# Patient Record
Sex: Male | Born: 1985 | Race: White | Hispanic: No | Marital: Married | Smoking: Never smoker
Health system: Southern US, Community
[De-identification: ages and names within clinical notes are randomized; demographics above are authoritative.]

## PROBLEM LIST (undated history)

## (undated) DIAGNOSIS — R7303 Prediabetes: Secondary | ICD-10-CM

## (undated) DIAGNOSIS — I10 Essential (primary) hypertension: Secondary | ICD-10-CM

## (undated) DIAGNOSIS — K760 Fatty (change of) liver, not elsewhere classified: Secondary | ICD-10-CM

## (undated) DIAGNOSIS — E785 Hyperlipidemia, unspecified: Secondary | ICD-10-CM

## (undated) DIAGNOSIS — K219 Gastro-esophageal reflux disease without esophagitis: Secondary | ICD-10-CM

## (undated) DIAGNOSIS — R519 Headache, unspecified: Secondary | ICD-10-CM

## (undated) DIAGNOSIS — G5600 Carpal tunnel syndrome, unspecified upper limb: Secondary | ICD-10-CM

---

## 2006-04-04 ENCOUNTER — Emergency Department (HOSPITAL_COMMUNITY): Admission: EM | Admit: 2006-04-04 | Discharge: 2006-04-04 | Payer: Self-pay | Admitting: Emergency Medicine

## 2008-08-18 IMAGING — CT CT CERVICAL SPINE W/O CM
4 of 6 series · 12 of 28 positions shown, 13 images · IV contrast (agent unspecified)
Comparison: none

CLINICAL DATA: 20-year-old male with left-sided head trauma, headache.
HEAD CT WITHOUT CONTRAST:
TECHNIQUE: Contiguous axial images were obtained from the base of the skull through the vertex according to standard protocol without contrast.
TECHNIQUE: Multidetector CT imaging of the cervical spine was performed.  Multiplanar CT image reconstructions were also generated.

[Series 3: recon 2: brain · axial · 0.47mm/px · z∈[-44,-7]mm · 2 of 80 slices shown]
[im 27/80  bone]
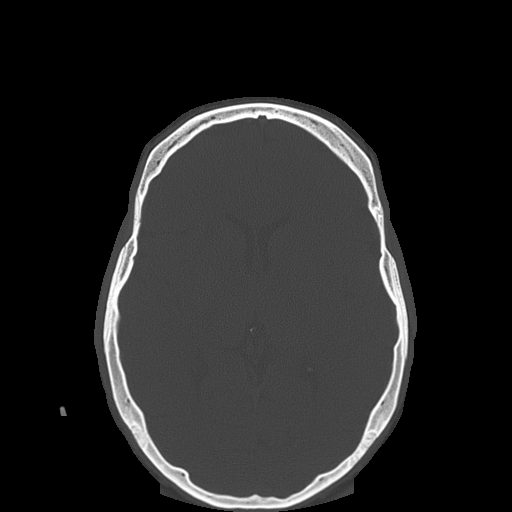
[im 53/80  bone]
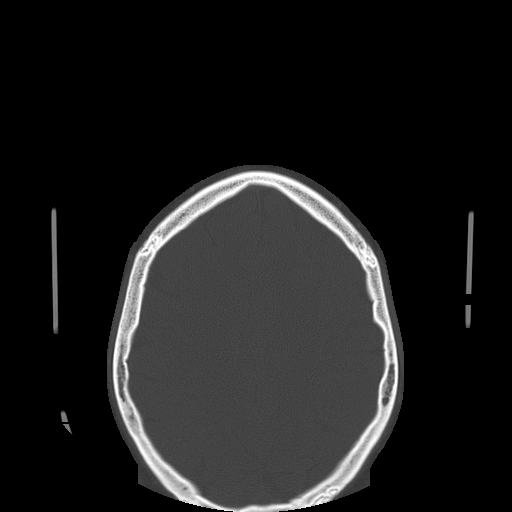

[Series 4: cervical spine · axial · 0.27mm/px · z∈[-299,-234]mm · 2 of 79 slices shown, 3 images]
[im 27/79  soft-tissue]
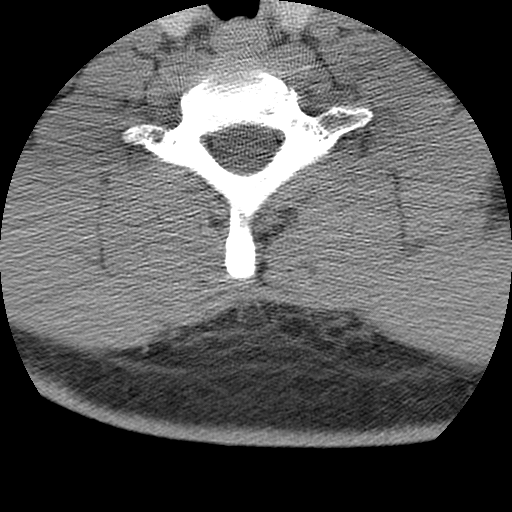
[im 27/79  bone]
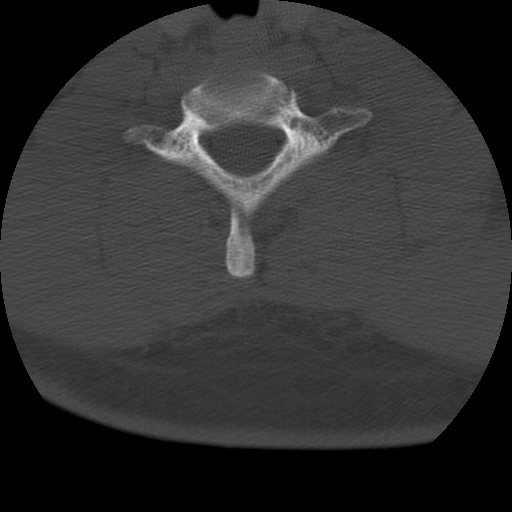
[im 53/79  bone]
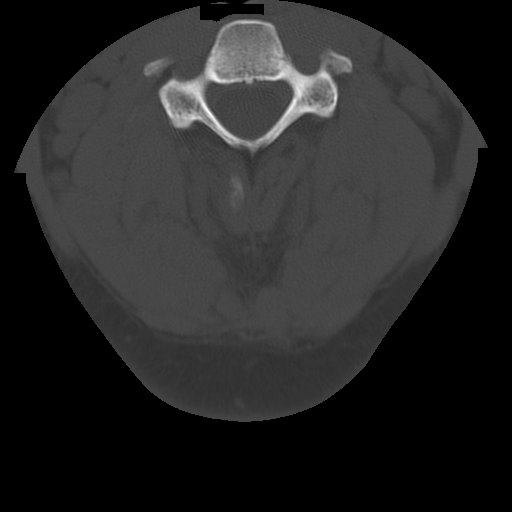

[Series 5: recon 2: cervical spine · axial · 0.27mm/px · z∈[-299,-234]mm · 2 of 78 slices shown]
[im 26/78  bone]
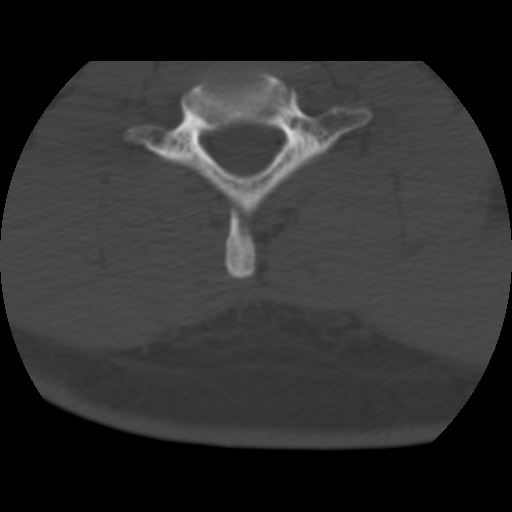
[im 52/78  bone]
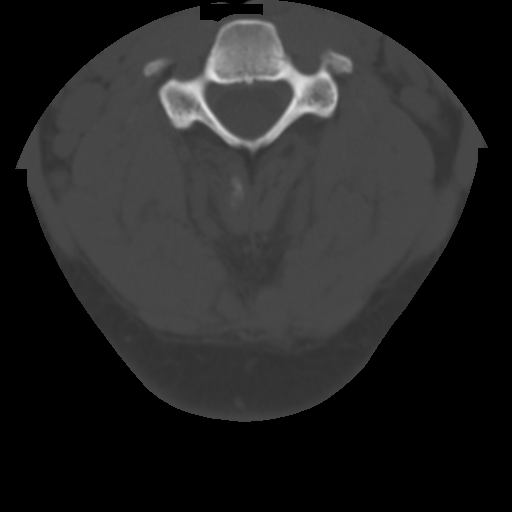

[Series 601: reformatted · coronal · 0.39mm/px · 6 of 92 slices shown]
[im 2/92  soft-tissue]
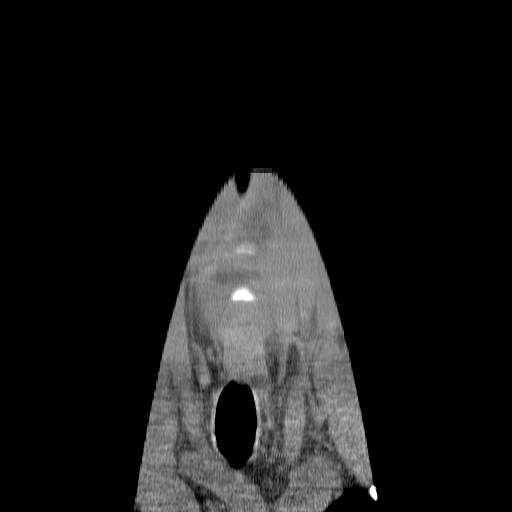
[im 23/92  bone]
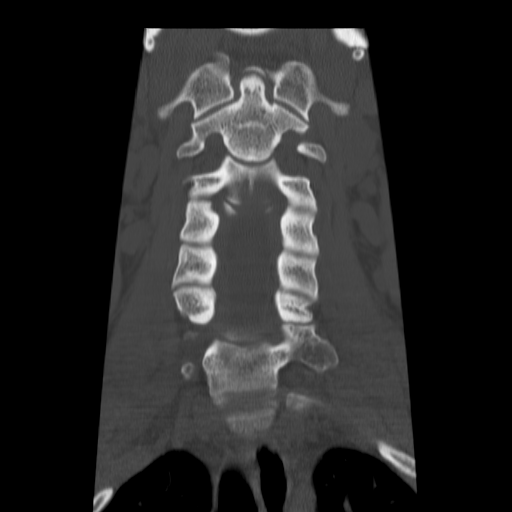
[im 35/92  bone]
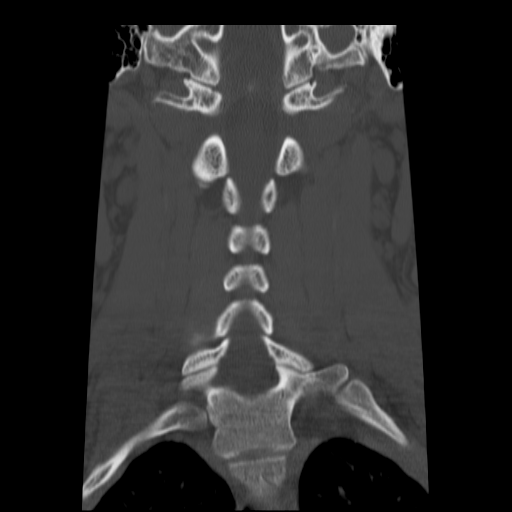
[im 46/92  bone]
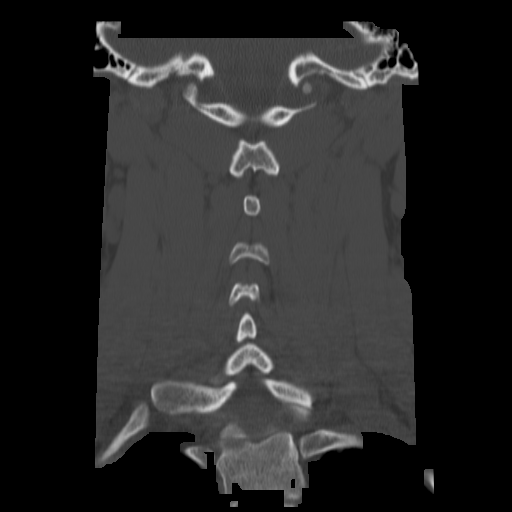
[im 57/92  bone]
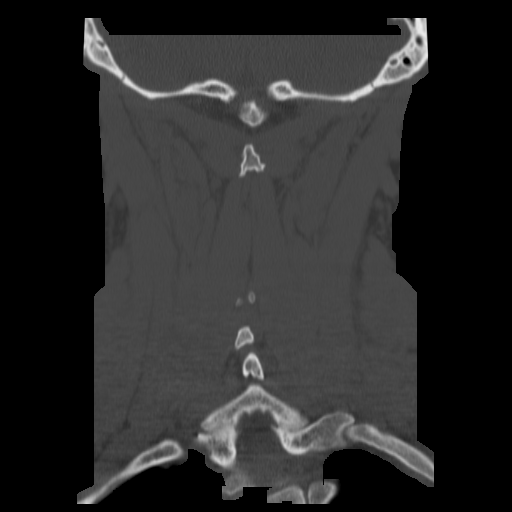
[im 69/92  bone]
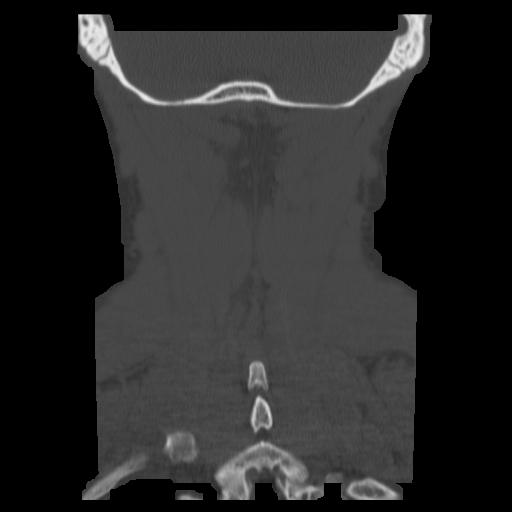

[12 of 28 positions shown; findings below may reference images not displayed]

FINDINGS: No acute intracranial mass effect, hemorrhage, infarction, hydrocephalus or mass lesion.  Gray white matter differentiation is maintained.  No extraaxial fluid collection.  The ventricles are symmetric and midline.  The cisterns are patent.  Mastoids are clear.  Sinuses are clear, except for polypoid mucosal thickening in the left maxillary sinus consistent with retention cyst or polyp.
IMPRESSION: 1. No acute intracranial finding.
2. Left maxillary sinus retention cyst or polyp.
CERVICAL SPINE CT WITHOUT CONTRAST:
FINDINGS: Cervical spine alignment is anatomic.  No compression fracture or deformity.  Vertebral body heights and disc spaces are preserved.  No evidence of epidural hematoma.  On the soft tissue windows, there is mild bilateral cervical lymphadenopathy.
IMPRESSION: 1. No acute finding of the cervical spine or static injury by CT. 
2. Mild cervical adenopathy.

## 2015-08-08 ENCOUNTER — Encounter (HOSPITAL_COMMUNITY): Payer: Self-pay | Admitting: Emergency Medicine

## 2015-08-08 ENCOUNTER — Emergency Department (HOSPITAL_COMMUNITY)
Admission: EM | Admit: 2015-08-08 | Discharge: 2015-08-08 | Disposition: A | Discharge status: Home / Self Care | Payer: Worker's Compensation | Attending: Emergency Medicine | Admitting: Emergency Medicine

## 2015-08-08 ENCOUNTER — Other Ambulatory Visit: Payer: Self-pay

## 2015-08-08 DIAGNOSIS — E785 Hyperlipidemia, unspecified: Secondary | ICD-10-CM | POA: Insufficient documentation

## 2015-08-08 DIAGNOSIS — Z79899 Other long term (current) drug therapy: Secondary | ICD-10-CM | POA: Insufficient documentation

## 2015-08-08 DIAGNOSIS — I10 Essential (primary) hypertension: Secondary | ICD-10-CM | POA: Insufficient documentation

## 2015-08-08 DIAGNOSIS — R1111 Vomiting without nausea: Secondary | ICD-10-CM | POA: Insufficient documentation

## 2015-08-08 DIAGNOSIS — T679XXA Effect of heat and light, unspecified, initial encounter: Secondary | ICD-10-CM

## 2015-08-08 DIAGNOSIS — X30XXXA Exposure to excessive natural heat, initial encounter: Secondary | ICD-10-CM | POA: Insufficient documentation

## 2015-08-08 DIAGNOSIS — R42 Dizziness and giddiness: Secondary | ICD-10-CM | POA: Diagnosis present

## 2015-08-08 HISTORY — DX: Essential (primary) hypertension: I10

## 2015-08-08 HISTORY — DX: Hyperlipidemia, unspecified: E78.5

## 2015-08-08 LAB — BASIC METABOLIC PANEL
ANION GAP: 7 (ref 5–15)
BUN: 17 mg/dL (ref 6–20)
CHLORIDE: 110 mmol/L (ref 101–111)
CO2: 23 mmol/L (ref 22–32)
Calcium: 8.8 mg/dL — ABNORMAL LOW (ref 8.9–10.3)
Creatinine, Ser: 1.21 mg/dL (ref 0.61–1.24)
GFR calc non Af Amer: >60 mL/min (ref >60)
Glucose, Bld: 95 mg/dL (ref 65–99)
Potassium: 3.6 mmol/L (ref 3.5–5.1)
Sodium: 140 mmol/L (ref 135–145)

## 2015-08-08 LAB — CBC
HEMATOCRIT: 41.3 % (ref 39.0–52.0)
HEMOGLOBIN: 14.2 g/dL (ref 13.0–17.0)
MCH: 30.1 pg (ref 26.0–34.0)
MCHC: 34.4 g/dL (ref 30.0–36.0)
MCV: 87.7 fL (ref 78.0–100.0)
Platelets: 275 10*3/uL (ref 150–400)
RBC: 4.71 MIL/uL (ref 4.22–5.81)
RDW: 13.2 % (ref 11.5–15.5)
WBC: 9.7 10*3/uL (ref 4.0–10.5)

## 2015-08-08 LAB — CBG MONITORING, ED: Glucose-Capillary: 75 mg/dL (ref 65–99)

## 2015-08-08 NOTE — ED Provider Notes (Signed)
CSN: [id number]     Arrival date & time 08/08/15  1630 History   First MD Initiated Contact with Patient 08/08/15 1742     Chief Complaint  Patient presents with  . Heat Exposure  . Near Syncope      Patient is a 30 y.o. male presenting with near-syncope. The history is provided by the patient.  Near Syncope Pertinent negatives include no chest pain, no abdominal pain, no headaches and no shortness of breath.  Patient was painting a house began to feel hot. Began to feel nauseous. When outside and felt more dizzy. States he felt like his parents out. Had more vomiting. He laid on his back. States people at work don't water on them and began to cool off. EMS gave him a liter of fluid. Patient felt better and is tolerated orals. No chest pain. States he's been doing well the last few days. He ate breakfast and lunch today. Not had an episode like this before. Does have a history of hypertension and hyperlipidemia. States he just got too hot. He does not smoke cigarettes.  Past Medical History  Diagnosis Date  . Hypertension   . Hyperlipemia    History reviewed. No pertinent past surgical history. No family history on file. Social History  Substance Use Topics  . Smoking status: Never Smoker   . Smokeless tobacco: None  . Alcohol Use: No    Review of Systems  Constitutional: Negative for activity change and appetite change.  Eyes: Negative for pain.  Respiratory: Negative for chest tightness and shortness of breath.   Cardiovascular: Positive for near-syncope. Negative for chest pain and leg swelling.  Gastrointestinal: Positive for vomiting. Negative for nausea, abdominal pain and diarrhea.  Genitourinary: Negative for flank pain.  Musculoskeletal: Negative for back pain and neck stiffness.  Skin: Negative for rash.  Neurological: Positive for light-headedness. Negative for weakness, numbness and headaches.  Psychiatric/Behavioral: Negative for behavioral problems.       Allergies  Review of patient's allergies indicates no known allergies.  Home Medications   Prior to Admission medications   Medication Sig Start Date End Date Taking? Authorizing Provider  lisinopril (PRINIVIL,ZESTRIL) 10 MG tablet Take 10 mg by mouth every evening.   Yes Historical Provider, MD  milk thistle 175 MG tablet Take 350 mg by mouth daily.   Yes Historical Provider, MD  phentermine 37.5 MG capsule Take 37.5 mg by mouth every morning.   Yes Historical Provider, MD   BP 107/97 mmHg  Pulse 93  Temp(Src) 97.6 F (36.4 C) (Oral)  Resp 16  SpO2 97% Physical Exam  Constitutional: He is oriented to person, place, and time. He appears well-developed and well-nourished.  Patient is overweight  HENT:  Head: Normocephalic and atraumatic.  Eyes: EOM are normal. Pupils are equal, round, and reactive to light.  Neck: Normal range of motion.  Cardiovascular: Normal rate, regular rhythm and normal heart sounds.   No murmur heard. Pulmonary/Chest: Effort normal and breath sounds normal.  Abdominal: Soft. Bowel sounds are normal. There is no tenderness.  Musculoskeletal: Normal range of motion. He exhibits no edema.  Neurological: He is alert and oriented to person, place, and time.  Skin: Skin is warm and dry.  Psychiatric: He has a normal mood and affect.  Nursing note and vitals reviewed.   ED Course  Procedures (including critical care time) Labs Review Labs Reviewed  BASIC METABOLIC PANEL - Abnormal; Notable for the following:    Calcium 8.8 (*)  All other components within normal limits  CBC  URINALYSIS, ROUTINE W REFLEX MICROSCOPIC (NOT AT [facility])  CBG MONITORING, ED    Imaging Review No results found. I have personally reviewed and evaluated these images and lab results as part of my medical decision-making.   EKG Interpretation   Date/Time:  Friday August 08 2015 16:53:58 EDT Ventricular Rate:  102 PR Interval:    QRS Duration: 99 QT Interval:   329 QTC Calculation: 429 R Axis:   66 Text Interpretation:  Sinus tachycardia Confirmed by Rubin PayorPICKERING  MD, Harrold DonathNATHAN  [phone] on 08/08/2015 5:49:44 PM      MDM   Final diagnoses:  Heat exposure, initial encounter    Patient with heat exposure. Feels much better after treatment. Has tolerated orals. Labs reassuring. Blood pressure is improved in heart rate has come down. Will discharge.    Benjiman CoreNathan Marin Wisner, MD 08/08/15 [phone]

## 2015-08-08 NOTE — ED Notes (Signed)
Pt unable to give urine sample at this time 

## 2015-08-08 NOTE — Discharge Instructions (Signed)
Heat Illness °If the body is unable maintain a proper body temperature in hot and/or humid conditions during physical activity, severe illness may occur. To maintain a relatively constant body temperature, the body radiates heat out to the environment and evaporates sweat. In very hot and/or humid conditions, these two methods of heat loss may not work properly. In order to perform physically in such a climate, the body must have time to acclimate (make physiological changes to compensate), such as increase the rate of sweating. When performing physical activity in hot and/or humid environments, it is important to stay hydrated. Adequate hydration will help the body sweat properly. If the body temperature is allowed to increase too much, a person's judgment and performance will decline. °SYMPTOMS  °· Dizziness. °· Fatigue. °· Changes in judgment. °· Muscle cramps. °· Weakness. °· Nausea and vomiting. °· Rapid heart rate. °· Fainting. °· Death. °· Diarrhea. °· Seizures. °· Liver failure. °· Kidney failure. °· Low blood pressure. °· Loss of consciousness (coma). °· Elevated body temperature. °CAUSES  °· Hot and/or humid conditions. °· Poor conditioning. °· Not being acclimated to the heat. °· Dehydration. °· Obesity. °· Inappropriate clothing (does not allow water to evaporate). °· Age (very old and very young people). °· Medications: diuretics, caffeine, decongestants, stimulants, some blood pressure medications. °RISK INCREASES WITH: °· Older age (decreased body water, decreased blood supply to skin, resulting in decreased sweating, decreased sweat rate). °· Young boys (decreased sweat rate compared with men). °· Dehydration. °· Not being acclimated to the heat (this takes 1 to 2 hours per day for a minimum of 6 days). °· Waiting until thirsty to drink. °· Use of stimulants. Amphetamines, cocaine, or decongestants increase risk for heat illness. °· Use of diuretics (increases urination). °· Use of medicines with  anticholinergic properties. °· Use of medicines that slow the heart rate. °PREVENTION  °· Maintain needed hydration before, during, and after exercise. °· Wear clothing that allows sweat to evaporate (light colored, lightweight, breathable). °· Take the time to acclimate to the heat. °· Avoid salt tablets (they irritate the stomach). °· Monitor weight after practices. °· Avoid physical activity during the hottest times of day. °PROGNOSIS  °Most athletes who suffer from heat illness will recover completely, if treated. Severe heat illness is a medical emergency and may require hospitalization. °RELATED COMPLICATIONS  °· Rhabdomyolysis (death of muscle, resulting in weakness and pain). °· Acute respiratory distress syndrome (lining of the lung is altered to prevent oxygen from getting into the bloodstream, which can result in death). °· Disseminated intravascular coagulation (spontaneous clotting of the blood, resulting in an inability to make normal blood clots when needed). °· Kidney failure. °· Liver failure. °· Seizures (abnormal electrical activity in the brain). °· Death. °TREATMENT °The most important treatment is to remove affected persons from the heat. Give the patient cool water to drink. For severe cases of heat illness, it may be necessary to cool the patient more aggressively, such as with an ice bath or cold shower. If symptoms persist after treatment, seek medical attention. °MEDICATION  °· Oxygen is used in severe cases, if there is lung damage. °· Fluid injections may be given for hydration. °ACTIVITY  °· A patient may return to sport as soon as he or she is able. °· Heat illness may make an athlete vulnerable to future episodes of heat illness. °· Allow the body to acclimate before performing in hot and/or humid conditions. °DIET  °Drink 8 oz. of fluid before exercise   and 4 oz. of fluid every 15 to 20 minutes during exercise. °As an alternative, try to drink about 1 quart of fluid for every hour of  exercise. °SEEK MEDICAL CARE IF:  °· You develop vomiting or diarrhea after exercising in the heat. °· Someone collapses while exercising in the heat. °· You have increasing problems exercising in the heat. °· You notice increased muscle aches after exercising in the heat. °· There is a change in the color of your urine after exercise. °  °This information is not intended to replace advice given to you by your health care provider. Make sure you discuss any questions you have with your health care provider. °  °Document Released: 02/01/2005 Document Revised: 04/26/2011 Document Reviewed: 08/21/2014 °Elsevier Interactive Patient Education ©2016 [employer] ° °

## 2015-08-08 NOTE — ED Notes (Signed)
Bed: WA09 Expected date:  Expected time:  Means of arrival:  Comments: 30 yo heat exhaustion

## 2015-08-08 NOTE — ED Notes (Signed)
Per EMS pt near syncope/heat exposure at work. Pt reports feeling lightheadedness and nausea; sat self on ground. Work sprayed pt off with water hose and gave bottle of water prior to EMS arrival. En route pt ST on monitor and given 500 ml 0.9 % NaCl bolus. With triage pt denies pain and states "feels better."

## 2015-08-11 NOTE — ED Provider Notes (Signed)
I received a call from Dr. Irish LackMansour. I was informed that she had sent patient to the ER and spoken with Dr. Effie ShyWentz. Dr. Effie ShyWentz was supposed to call her back and give her the final report, but he didn't. She was inquiring if patient had received medications on discharge.  I informed her that patient was seen by a different provider and thus she might have not received a call. I did inform her that i couldn't find any meds on the discharge side.  She asked me if the patient had a work note, at which time i informed her that we will need HIPAA form to get more information. She informed me that they consent form was sent with the patient, and they perform peer to peer call in these cases (which is why she called Dr. Effie ShyWentz). I informed her that i will need HIPAA to release more information.  I just received a fax now and it's for a different patient - Phillil Trogden. I have called the agency - [facility] never picked up, no message left. Called [phone], spoke with a rep, and advised that they contact our medical records with correct HIPAA if they need further records and not call the EDP.    Derwood KaplanAnkit Mallie Giambra, MD 08/11/15 1742

## 2020-10-28 DIAGNOSIS — R051 Acute cough: Secondary | ICD-10-CM | POA: Diagnosis not present

## 2020-10-28 DIAGNOSIS — R0600 Dyspnea, unspecified: Secondary | ICD-10-CM | POA: Diagnosis not present

## 2020-10-28 DIAGNOSIS — R000 Tachycardia, unspecified: Secondary | ICD-10-CM | POA: Diagnosis not present

## 2020-10-28 DIAGNOSIS — J209 Acute bronchitis, unspecified: Secondary | ICD-10-CM | POA: Diagnosis not present

## 2021-05-27 DIAGNOSIS — R000 Tachycardia, unspecified: Secondary | ICD-10-CM | POA: Diagnosis not present

## 2021-08-12 DIAGNOSIS — K760 Fatty (change of) liver, not elsewhere classified: Secondary | ICD-10-CM | POA: Diagnosis not present

## 2021-08-12 DIAGNOSIS — K8050 Calculus of bile duct without cholangitis or cholecystitis without obstruction: Secondary | ICD-10-CM | POA: Diagnosis not present

## 2021-08-12 DIAGNOSIS — R109 Unspecified abdominal pain: Secondary | ICD-10-CM | POA: Diagnosis not present

## 2021-08-12 DIAGNOSIS — N281 Cyst of kidney, acquired: Secondary | ICD-10-CM | POA: Diagnosis not present

## 2021-08-12 DIAGNOSIS — R1011 Right upper quadrant pain: Secondary | ICD-10-CM | POA: Diagnosis not present

## 2021-08-13 DIAGNOSIS — R1011 Right upper quadrant pain: Secondary | ICD-10-CM | POA: Diagnosis not present

## 2021-08-14 DIAGNOSIS — R1011 Right upper quadrant pain: Secondary | ICD-10-CM | POA: Diagnosis not present

## 2021-08-14 DIAGNOSIS — K760 Fatty (change of) liver, not elsewhere classified: Secondary | ICD-10-CM | POA: Diagnosis not present

## 2021-08-14 DIAGNOSIS — N281 Cyst of kidney, acquired: Secondary | ICD-10-CM | POA: Diagnosis not present

## 2021-08-14 DIAGNOSIS — K824 Cholesterolosis of gallbladder: Secondary | ICD-10-CM | POA: Diagnosis not present

## 2021-08-28 DIAGNOSIS — Z13220 Encounter for screening for lipoid disorders: Secondary | ICD-10-CM | POA: Diagnosis not present

## 2021-08-28 DIAGNOSIS — Z0000 Encounter for general adult medical examination without abnormal findings: Secondary | ICD-10-CM | POA: Diagnosis not present

## 2021-08-28 DIAGNOSIS — R1011 Right upper quadrant pain: Secondary | ICD-10-CM | POA: Diagnosis not present

## 2021-08-28 DIAGNOSIS — E668 Other obesity: Secondary | ICD-10-CM | POA: Diagnosis not present

## 2021-09-02 ENCOUNTER — Other Ambulatory Visit (HOSPITAL_COMMUNITY): Payer: Self-pay | Admitting: Physician Assistant

## 2021-09-02 ENCOUNTER — Other Ambulatory Visit: Payer: Self-pay | Admitting: Physician Assistant

## 2021-09-02 DIAGNOSIS — R1011 Right upper quadrant pain: Secondary | ICD-10-CM

## 2021-09-03 DIAGNOSIS — R7301 Impaired fasting glucose: Secondary | ICD-10-CM | POA: Diagnosis not present

## 2021-09-14 ENCOUNTER — Encounter (HOSPITAL_COMMUNITY)
Admission: RE | Admit: 2021-09-14 | Discharge: 2021-09-14 | Disposition: A | Discharge status: Home / Self Care | Payer: BC Managed Care – PPO | Source: Ambulatory Visit | Attending: Physician Assistant | Admitting: Physician Assistant

## 2021-09-14 DIAGNOSIS — R1011 Right upper quadrant pain: Secondary | ICD-10-CM | POA: Diagnosis not present

## 2021-09-14 DIAGNOSIS — R109 Unspecified abdominal pain: Secondary | ICD-10-CM | POA: Diagnosis not present

## 2021-09-14 DIAGNOSIS — R112 Nausea with vomiting, unspecified: Secondary | ICD-10-CM | POA: Diagnosis not present

## 2021-09-14 MED ORDER — TECHNETIUM TC 99M MEBROFENIN IV KIT
5.3000 | PACK | Freq: Once | INTRAVENOUS | Status: AC | PRN
  Administered 2021-09-14: 5.3 via INTRAVENOUS

## 2021-09-18 DIAGNOSIS — R160 Hepatomegaly, not elsewhere classified: Secondary | ICD-10-CM | POA: Diagnosis not present

## 2021-09-18 DIAGNOSIS — K760 Fatty (change of) liver, not elsewhere classified: Secondary | ICD-10-CM | POA: Diagnosis not present

## 2021-09-18 DIAGNOSIS — K828 Other specified diseases of gallbladder: Secondary | ICD-10-CM | POA: Diagnosis not present

## 2021-09-30 ENCOUNTER — Telehealth: Payer: Self-pay | Admitting: Hematology and Oncology

## 2021-09-30 NOTE — Telephone Encounter (Signed)
Scheduled appt per 8/15 referral. Pt is aware of appt date and time. Pt is aware to arrive 15 mins prior to appt time and to bring and updated insurance card. Pt is aware of appt location.   

## 2021-10-13 ENCOUNTER — Ambulatory Visit: Payer: Self-pay | Admitting: Surgery

## 2021-10-13 DIAGNOSIS — K828 Other specified diseases of gallbladder: Secondary | ICD-10-CM | POA: Diagnosis not present

## 2021-10-13 DIAGNOSIS — R1011 Right upper quadrant pain: Secondary | ICD-10-CM | POA: Diagnosis not present

## 2021-10-13 NOTE — H&P (Signed)
History of Present Illness: Adam Miles is a 36 y.o. male who was referred to me for evaluation of biliary dyskinesia. He was seen in the [place] ED about 2 months ago with RUQ abdominal pain. Per his report, CT and ultrasound at that time were normal. He followed up with his PCP on 7/14 and was sent for a HIDA, which showed a mildly reduced gallbladder EF of 28%. Labs on 7/15 showed a normal bilirubin with very mildly elevated transaminases (AST/ALT 48/71). He was referred to discuss cholecystectomy. Today he has continued to have sharp pain in the RUQ daily, which gets worse after eating, particularly acidic foods. He also has associated nausea and it has been difficult for him to work.   He has never had any prior abdominal surgeries.     Review of Systems: A complete review of systems was obtained from the patient.  I have reviewed this information and discussed as appropriate with the patient.  See HPI as well for other ROS.       Medical History: Past Medical History Past Medical History: Diagnosis Date  Liver disease        Patient Active Problem List Diagnosis  RUQ pain     Past Surgical History History reviewed. No pertinent surgical history.     AllergiesExpand by Default No Known Allergies    Current Outpatient Medications on File Prior to Visit Medication Sig Dispense Refill  aspirin (EXTRA STRENGTH BAYER) 500 MG tablet Take 500 mg by mouth every 6 (six) hours as needed for Pain      traMADoL (ULTRAM) 50 mg tablet TAKE 1 TABLET (50 MG) BY MOUTH EVERY 4-6 HOURS AS NEEDED FOR MILD TO MODERATE PAIN       No current facility-administered medications on file prior to visit.     Family History Family History Problem Relation Age of Onset  Diabetes Mother    Colon cancer Mother    High blood pressure (Hypertension) Father    Coronary Artery Disease (Blocked arteries around heart) Father        Social History   Tobacco Use Smoking Status Never Smokeless  Tobacco Not on file     Social History Social History    Socioeconomic History  Marital status: Married Tobacco Use  Smoking status: Never Substance and Sexual Activity  Alcohol use: Yes  Drug use: Never      Objective:     Vitals:   10/13/21 1033 BP: 126/82 Pulse: (!) 123 Temp: 37 C (98.6 F) SpO2: 98% Weight: (!) 157.5 kg (347 lb 3.2 oz) Height: 180.3 cm (5\' 11" )   Body mass index is 48.42 kg/m.   Physical Exam Vitals reviewed.  Constitutional:      General: He is not in acute distress.    Appearance: Normal appearance.  HENT:     Head: Normocephalic and atraumatic.  Eyes:     General: No scleral icterus.    Conjunctiva/sclera: Conjunctivae normal.  Cardiovascular:     Rate and Rhythm: Normal rate and regular rhythm.     Heart sounds: No murmur heard. Pulmonary:     Effort: No respiratory distress.     Breath sounds: Normal breath sounds. No wheezing.  Abdominal:     General: There is no distension.     Palpations: Abdomen is soft.     Tenderness: There is no abdominal tenderness.     Comments: No surgical scars.  Musculoskeletal:        General: No swelling or deformity. Normal  range of motion.  Skin:    General: Skin is warm and dry.     Coloration: Skin is not jaundiced.  Neurological:     General: No focal deficit present.     Mental Status: He is alert and oriented to person, place, and time.  Psychiatric:        Mood and Affect: Mood normal.        Behavior: Behavior normal.        Thought Content: Thought content normal.          Assessment and Plan: Diagnoses and all orders for this visit:   Biliary dyskinesia -     CCS Case Posting Request; Future   RUQ pain     This is a 36 yo male presenting with postprandial RUQ pain. His symptoms are consistent with biliary colic. I reviewed his referral notes, HIDA, and was also able to review the images from his outside RUQ [place] and CT scan. There is hepatic steatosis with possible  gallbladder sludge, but no gallstones. Gallbladder EF is mildly decreased. Given abnormal HIDA and symptoms that are consistent with biliary colic, laparoscopic cholecystectomy was recommended. The details of this procedure were discussed with the patient, including the risks of bleeding, infection, bile leak, and <0.5% risk of common bile duct injury. The patient expressed understanding and agrees to proceed with surgery. He will be contacted to schedule an elective surgery date.   Sophronia Simas, MD [facility] Surgery General, Hepatobiliary and Pancreatic Surgery 10/13/21 11:24 AM

## 2021-10-13 NOTE — H&P (View-Only) (Signed)
History of Present Illness: Adam Miles is a 36 y.o. male who was referred to me for evaluation of biliary dyskinesia. He was seen in the [place] ED about 2 months ago with RUQ abdominal pain. Per his report, CT and ultrasound at that time were normal. He followed up with his PCP on 7/14 and was sent for a HIDA, which showed a mildly reduced gallbladder EF of 28%. Labs on 7/15 showed a normal bilirubin with very mildly elevated transaminases (AST/ALT 48/71). He was referred to discuss cholecystectomy. Today he has continued to have sharp pain in the RUQ daily, which gets worse after eating, particularly acidic foods. He also has associated nausea and it has been difficult for him to work.   He has never had any prior abdominal surgeries.     Review of Systems: A complete review of systems was obtained from the patient.  I have reviewed this information and discussed as appropriate with the patient.  See HPI as well for other ROS.       Medical History: Past Medical History Past Medical History: Diagnosis Date  Liver disease        Patient Active Problem List Diagnosis  RUQ pain     Past Surgical History History reviewed. No pertinent surgical history.     AllergiesExpand by Default No Known Allergies    Current Outpatient Medications on File Prior to Visit Medication Sig Dispense Refill  aspirin (EXTRA STRENGTH BAYER) 500 MG tablet Take 500 mg by mouth every 6 (six) hours as needed for Pain      traMADoL (ULTRAM) 50 mg tablet TAKE 1 TABLET (50 MG) BY MOUTH EVERY 4-6 HOURS AS NEEDED FOR MILD TO MODERATE PAIN       No current facility-administered medications on file prior to visit.     Family History Family History Problem Relation Age of Onset  Diabetes Mother    Colon cancer Mother    High blood pressure (Hypertension) Father    Coronary Artery Disease (Blocked arteries around heart) Father        Social History   Tobacco Use Smoking Status Never Smokeless  Tobacco Not on file     Social History Social History    Socioeconomic History  Marital status: Married Tobacco Use  Smoking status: Never Substance and Sexual Activity  Alcohol use: Yes  Drug use: Never      Objective:     Vitals:   10/13/21 1033 BP: 126/82 Pulse: (!) 123 Temp: 37 C (98.6 F) SpO2: 98% Weight: (!) 157.5 kg (347 lb 3.2 oz) Height: 180.3 cm (5\' 11" )   Body mass index is 48.42 kg/m.   Physical Exam Vitals reviewed.  Constitutional:      General: He is not in acute distress.    Appearance: Normal appearance.  HENT:     Head: Normocephalic and atraumatic.  Eyes:     General: No scleral icterus.    Conjunctiva/sclera: Conjunctivae normal.  Cardiovascular:     Rate and Rhythm: Normal rate and regular rhythm.     Heart sounds: No murmur heard. Pulmonary:     Effort: No respiratory distress.     Breath sounds: Normal breath sounds. No wheezing.  Abdominal:     General: There is no distension.     Palpations: Abdomen is soft.     Tenderness: There is no abdominal tenderness.     Comments: No surgical scars.  Musculoskeletal:        General: No swelling or deformity. Normal  range of motion.  Skin:    General: Skin is warm and dry.     Coloration: Skin is not jaundiced.  Neurological:     General: No focal deficit present.     Mental Status: He is alert and oriented to person, place, and time.  Psychiatric:        Mood and Affect: Mood normal.        Behavior: Behavior normal.        Thought Content: Thought content normal.          Assessment and Plan: Diagnoses and all orders for this visit:   Biliary dyskinesia -     CCS Case Posting Request; Future   RUQ pain     This is a 36 yo male presenting with postprandial RUQ pain. His symptoms are consistent with biliary colic. I reviewed his referral notes, HIDA, and was also able to review the images from his outside RUQ US and CT scan. There is hepatic steatosis with possible  gallbladder sludge, but no gallstones. Gallbladder EF is mildly decreased. Given abnormal HIDA and symptoms that are consistent with biliary colic, laparoscopic cholecystectomy was recommended. The details of this procedure were discussed with the patient, including the risks of bleeding, infection, bile leak, and <0.5% risk of common bile duct injury. The patient expressed understanding and agrees to proceed with surgery. He will be contacted to schedule an elective surgery date.   Damien Cisar, MD [facility], Hepatobiliary and Pancreatic Surgery 10/13/21 11:24 AM  

## 2021-10-21 ENCOUNTER — Telehealth: Payer: Self-pay | Admitting: *Deleted

## 2021-10-21 NOTE — Telephone Encounter (Signed)
TCT patient at Dr. Derek Mound request. Dr. Leonides Schanz does not see the need for this patient to come in to see him at this time, after carefull review of his chart/referral note. Pt is due to have gall bladder removed next week. Dr. Leonides Schanz advised that if pt still had elevated WBC after pt has recovered from surgery we could see him at that time.  Pt has been evaluated by GI for liver disease and very slightly enlarged spleen (on 1 out of 2 [place] scans). Spoke to pt about the above. He is comfortable about canceling his appt with Dr. Leonides Schanz this week.Marland Kitchen He knows to contact us in the future should he need Hematology referral.

## 2021-10-22 ENCOUNTER — Inpatient Hospital Stay: Payer: BC Managed Care – PPO | Admitting: Hematology and Oncology

## 2021-10-22 ENCOUNTER — Inpatient Hospital Stay: Payer: BC Managed Care – PPO

## 2021-10-26 ENCOUNTER — Encounter (HOSPITAL_COMMUNITY): Payer: Self-pay | Admitting: Surgery

## 2021-10-26 ENCOUNTER — Other Ambulatory Visit: Payer: Self-pay

## 2021-10-26 NOTE — Progress Notes (Addendum)
Adam Miles denies chest pain or shortness of breath. Patient denies having any s/s of Covid in his  household, also denies any known exposure to Covid.   Adam Miles PCP is Dorien Chihuahua, [place] at [facility] on [address].  Adam Miles has a history of HTN, patient was taking medications for it , but he did not get refills.  This was before he begin seeing Costello, Adam Miles.  Patient reports that Costell, Adam Miles did not say anything about blood pressure. Adam Miles said that he has started drinking water.

## 2021-10-27 ENCOUNTER — Encounter (HOSPITAL_COMMUNITY): Payer: Self-pay | Admitting: Surgery

## 2021-10-27 NOTE — Anesthesia Preprocedure Evaluation (Addendum)
Anesthesia Evaluation  Patient identified by MRN, date of birth, ID band Patient awake    Reviewed: Allergy & Precautions, NPO status , Patient's Chart, lab work & pertinent test results  History of Anesthesia Complications Negative for: history of anesthetic complications  Airway Mallampati: II  TM Distance: >3 FB Neck ROM: Full    Dental no notable dental hx.    Pulmonary neg pulmonary ROS,    Pulmonary exam normal        Cardiovascular hypertension, Normal cardiovascular exam     Neuro/Psych    GI/Hepatic Neg liver ROS, GERD  Medicated and Controlled,BILIARY DYSKINESIA   Endo/Other  Morbid obesity (BMI 48)  Renal/GU negative Renal ROS  negative genitourinary   Musculoskeletal negative musculoskeletal ROS (+)   Abdominal   Peds  Hematology negative hematology ROS (+)   Anesthesia Other Findings Day of surgery medications reviewed with patient.  Reproductive/Obstetrics negative OB ROS                            Anesthesia Physical Anesthesia Plan  ASA: 3  Anesthesia Plan: General   Post-op Pain Management: Tylenol PO (pre-op)* and Toradol IV (intra-op)*   Induction: Intravenous  PONV Risk Score and Plan: 3 and Treatment may vary due to age or medical condition, Midazolam, Dexamethasone and Ondansetron  Airway Management Planned: Oral ETT  Additional Equipment: None  Intra-op Plan:   Post-operative Plan: Extubation in OR  Informed Consent: I have reviewed the patients History and Physical, chart, labs and discussed the procedure including the risks, benefits and alternatives for the proposed anesthesia with the patient or authorized representative who has indicated his/her understanding and acceptance.     Dental advisory given  Plan Discussed with: CRNA  Anesthesia Plan Comments:        Anesthesia Quick Evaluation

## 2021-10-28 ENCOUNTER — Encounter (HOSPITAL_COMMUNITY): Payer: Self-pay | Admitting: Surgery

## 2021-10-28 ENCOUNTER — Encounter (HOSPITAL_COMMUNITY)
Admission: RE | Disposition: A | Discharge status: Home / Self Care | Payer: Self-pay | Source: Ambulatory Visit | Attending: Surgery

## 2021-10-28 ENCOUNTER — Other Ambulatory Visit: Payer: Self-pay

## 2021-10-28 ENCOUNTER — Ambulatory Visit (HOSPITAL_COMMUNITY): Payer: BC Managed Care – PPO | Admitting: Anesthesiology

## 2021-10-28 ENCOUNTER — Ambulatory Visit (HOSPITAL_COMMUNITY)
Admission: RE | Admit: 2021-10-28 | Discharge: 2021-10-28 | Disposition: A | Discharge status: Home / Self Care | Payer: BC Managed Care – PPO | Source: Ambulatory Visit | Attending: Surgery | Admitting: Surgery

## 2021-10-28 DIAGNOSIS — K811 Chronic cholecystitis: Secondary | ICD-10-CM | POA: Diagnosis not present

## 2021-10-28 DIAGNOSIS — K760 Fatty (change of) liver, not elsewhere classified: Secondary | ICD-10-CM | POA: Diagnosis not present

## 2021-10-28 DIAGNOSIS — K828 Other specified diseases of gallbladder: Secondary | ICD-10-CM | POA: Insufficient documentation

## 2021-10-28 DIAGNOSIS — Z6841 Body Mass Index (BMI) 40.0 and over, adult: Secondary | ICD-10-CM | POA: Diagnosis not present

## 2021-10-28 DIAGNOSIS — I10 Essential (primary) hypertension: Secondary | ICD-10-CM | POA: Diagnosis not present

## 2021-10-28 DIAGNOSIS — K219 Gastro-esophageal reflux disease without esophagitis: Secondary | ICD-10-CM | POA: Insufficient documentation

## 2021-10-28 HISTORY — DX: Prediabetes: R73.03

## 2021-10-28 HISTORY — DX: Carpal tunnel syndrome, unspecified upper limb: G56.00

## 2021-10-28 HISTORY — DX: Gastro-esophageal reflux disease without esophagitis: K21.9

## 2021-10-28 HISTORY — DX: Fatty (change of) liver, not elsewhere classified: K76.0

## 2021-10-28 HISTORY — DX: Headache, unspecified: R51.9

## 2021-10-28 HISTORY — PX: CHOLECYSTECTOMY: SHX55

## 2021-10-28 LAB — CBC
HCT: 49.9 % (ref 39.0–52.0)
Hemoglobin: 16.4 g/dL (ref 13.0–17.0)
MCH: 29.3 pg (ref 26.0–34.0)
MCHC: 32.9 g/dL (ref 30.0–36.0)
MCV: 89.3 fL (ref 80.0–100.0)
Platelets: 290 10*3/uL (ref 150–400)
RBC: 5.59 MIL/uL (ref 4.22–5.81)
RDW: 12.9 % (ref 11.5–15.5)
WBC: 9.3 10*3/uL (ref 4.0–10.5)
nRBC: 0 % (ref 0.0–0.2)

## 2021-10-28 LAB — BASIC METABOLIC PANEL
Anion gap: 10 (ref 5–15)
BUN: 8 mg/dL (ref 6–20)
CO2: 23 mmol/L (ref 22–32)
Calcium: 8.9 mg/dL (ref 8.9–10.3)
Chloride: 107 mmol/L (ref 98–111)
Creatinine, Ser: 1.08 mg/dL (ref 0.61–1.24)
GFR, Estimated: >60 mL/min (ref >60)
Glucose, Bld: 104 mg/dL — ABNORMAL HIGH (ref 70–99)
Potassium: 4 mmol/L (ref 3.5–5.1)
Sodium: 140 mmol/L (ref 135–145)

## 2021-10-28 SURGERY — LAPAROSCOPIC CHOLECYSTECTOMY
Anesthesia: General | Site: Abdomen

## 2021-10-28 MED ORDER — AMISULPRIDE (ANTIEMETIC) 5 MG/2ML IV SOLN: 10.0000 mg | Freq: Once | INTRAVENOUS | Status: DC | PRN

## 2021-10-28 MED ORDER — PHENYLEPHRINE 80 MCG/ML (10ML) SYRINGE FOR IV PUSH (FOR BLOOD PRESSURE SUPPORT)
PREFILLED_SYRINGE | INTRAVENOUS | Status: DC | PRN
  Administered 2021-10-28 (×4): 80 ug via INTRAVENOUS

## 2021-10-28 MED ORDER — ROCURONIUM BROMIDE 10 MG/ML (PF) SYRINGE
PREFILLED_SYRINGE | INTRAVENOUS | Status: DC | PRN
  Administered 2021-10-28: 10 mg via INTRAVENOUS
  Administered 2021-10-28: 20 mg via INTRAVENOUS
  Administered 2021-10-28: 50 mg via INTRAVENOUS

## 2021-10-28 MED ORDER — DEXAMETHASONE SODIUM PHOSPHATE 10 MG/ML IJ SOLN
INTRAMUSCULAR | Status: AC
  Filled 2021-10-28: qty 1

## 2021-10-28 MED ORDER — CHLORHEXIDINE GLUCONATE 0.12 % MT SOLN
15.0000 mL | Freq: Once | OROMUCOSAL | Status: AC
  Administered 2021-10-28: 15 mL via OROMUCOSAL
  Filled 2021-10-28: qty 15

## 2021-10-28 MED ORDER — ACETAMINOPHEN 500 MG PO TABS: 1000.0000 mg | ORAL_TABLET | ORAL | Status: DC

## 2021-10-28 MED ORDER — SUGAMMADEX SODIUM 200 MG/2ML IV SOLN
INTRAVENOUS | Status: DC | PRN
  Administered 2021-10-28: 500 mg via INTRAVENOUS

## 2021-10-28 MED ORDER — FENTANYL CITRATE (PF) 100 MCG/2ML IJ SOLN
25.0000 ug | INTRAMUSCULAR | Status: DC | PRN
  Administered 2021-10-28 (×2): 50 ug via INTRAVENOUS

## 2021-10-28 MED ORDER — PHENYLEPHRINE 80 MCG/ML (10ML) SYRINGE FOR IV PUSH (FOR BLOOD PRESSURE SUPPORT)
PREFILLED_SYRINGE | INTRAVENOUS | Status: AC
  Filled 2021-10-28: qty 10

## 2021-10-28 MED ORDER — DEXAMETHASONE SODIUM PHOSPHATE 10 MG/ML IJ SOLN
INTRAMUSCULAR | Status: DC | PRN
  Administered 2021-10-28: 8 mg via INTRAVENOUS

## 2021-10-28 MED ORDER — CEFAZOLIN IN SODIUM CHLORIDE 3-0.9 GM/100ML-% IV SOLN
3.0000 g | INTRAVENOUS | Status: AC
  Administered 2021-10-28: 3 g via INTRAVENOUS

## 2021-10-28 MED ORDER — CEFAZOLIN IN SODIUM CHLORIDE 3-0.9 GM/100ML-% IV SOLN
INTRAVENOUS | Status: AC
  Filled 2021-10-28: qty 100

## 2021-10-28 MED ORDER — OXYCODONE HCL 5 MG/5ML PO SOLN: 5.0000 mg | Freq: Once | ORAL | Status: DC | PRN

## 2021-10-28 MED ORDER — FENTANYL CITRATE (PF) 250 MCG/5ML IJ SOLN
INTRAMUSCULAR | Status: AC
  Filled 2021-10-28: qty 5

## 2021-10-28 MED ORDER — ONDANSETRON HCL 4 MG/2ML IJ SOLN
INTRAMUSCULAR | Status: DC | PRN
  Administered 2021-10-28: 4 mg via INTRAVENOUS

## 2021-10-28 MED ORDER — GABAPENTIN 300 MG PO CAPS: 300.0000 mg | ORAL_CAPSULE | ORAL | Status: AC

## 2021-10-28 MED ORDER — SODIUM CHLORIDE 0.9 % IR SOLN
Status: DC | PRN
  Administered 2021-10-28: 2000 mL

## 2021-10-28 MED ORDER — MIDAZOLAM HCL 2 MG/2ML IJ SOLN
INTRAMUSCULAR | Status: AC
  Filled 2021-10-28: qty 2

## 2021-10-28 MED ORDER — KETOROLAC TROMETHAMINE 30 MG/ML IJ SOLN
INTRAMUSCULAR | Status: DC | PRN
  Administered 2021-10-28: 30 mg via INTRAVENOUS

## 2021-10-28 MED ORDER — FENTANYL CITRATE (PF) 250 MCG/5ML IJ SOLN
INTRAMUSCULAR | Status: DC | PRN
  Administered 2021-10-28: 50 ug via INTRAVENOUS
  Administered 2021-10-28: 100 ug via INTRAVENOUS
  Administered 2021-10-28: 50 ug via INTRAVENOUS

## 2021-10-28 MED ORDER — FENTANYL CITRATE (PF) 100 MCG/2ML IJ SOLN
INTRAMUSCULAR | Status: AC
  Filled 2021-10-28: qty 2

## 2021-10-28 MED ORDER — ACETAMINOPHEN 500 MG PO TABS
ORAL_TABLET | ORAL | Status: AC
  Administered 2021-10-28: 1000 mg via ORAL
  Filled 2021-10-28: qty 1

## 2021-10-28 MED ORDER — SUCCINYLCHOLINE CHLORIDE 200 MG/10ML IV SOSY
PREFILLED_SYRINGE | INTRAVENOUS | Status: DC | PRN
  Administered 2021-10-28: 200 mg via INTRAVENOUS

## 2021-10-28 MED ORDER — 0.9 % SODIUM CHLORIDE (POUR BTL) OPTIME
TOPICAL | Status: DC | PRN
  Administered 2021-10-28: 1000 mL

## 2021-10-28 MED ORDER — GABAPENTIN 300 MG PO CAPS
ORAL_CAPSULE | ORAL | Status: AC
  Administered 2021-10-28: 300 mg via ORAL
  Filled 2021-10-28: qty 1

## 2021-10-28 MED ORDER — SODIUM CHLORIDE 0.9 % IR SOLN
Status: DC | PRN
  Administered 2021-10-28: 1000 mL

## 2021-10-28 MED ORDER — KETOROLAC TROMETHAMINE 30 MG/ML IJ SOLN
INTRAMUSCULAR | Status: AC
  Filled 2021-10-28: qty 1

## 2021-10-28 MED ORDER — ORAL CARE MOUTH RINSE: 15.0000 mL | Freq: Once | OROMUCOSAL | Status: AC

## 2021-10-28 MED ORDER — ONDANSETRON HCL 4 MG/2ML IJ SOLN
INTRAMUSCULAR | Status: AC
  Filled 2021-10-28: qty 2

## 2021-10-28 MED ORDER — OXYCODONE HCL 5 MG PO TABS: 5.0000 mg | ORAL_TABLET | Freq: Once | ORAL | Status: DC | PRN

## 2021-10-28 MED ORDER — PROPOFOL 10 MG/ML IV BOLUS
INTRAVENOUS | Status: AC
  Filled 2021-10-28: qty 20

## 2021-10-28 MED ORDER — SUGAMMADEX SODIUM 500 MG/5ML IV SOLN
INTRAVENOUS | Status: AC
  Filled 2021-10-28: qty 5

## 2021-10-28 MED ORDER — ACETAMINOPHEN 500 MG PO TABS: 1000.0000 mg | ORAL_TABLET | Freq: Once | ORAL | Status: AC

## 2021-10-28 MED ORDER — BUPIVACAINE-EPINEPHRINE (PF) 0.25% -1:200000 IJ SOLN
INTRAMUSCULAR | Status: AC
  Filled 2021-10-28: qty 30

## 2021-10-28 MED ORDER — LACTATED RINGERS IV SOLN: INTRAVENOUS | Status: DC

## 2021-10-28 MED ORDER — MIDAZOLAM HCL 2 MG/2ML IJ SOLN
INTRAMUSCULAR | Status: DC | PRN
  Administered 2021-10-28: 4 mg via INTRAVENOUS

## 2021-10-28 MED ORDER — LIDOCAINE 2% (20 MG/ML) 5 ML SYRINGE
INTRAMUSCULAR | Status: DC | PRN
  Administered 2021-10-28: 100 mg via INTRAVENOUS

## 2021-10-28 MED ORDER — PROPOFOL 10 MG/ML IV BOLUS
INTRAVENOUS | Status: DC | PRN
  Administered 2021-10-28: 200 mg via INTRAVENOUS

## 2021-10-28 MED ORDER — BUPIVACAINE-EPINEPHRINE 0.25% -1:200000 IJ SOLN
INTRAMUSCULAR | Status: DC | PRN
  Administered 2021-10-28: 25 mL

## 2021-10-28 MED ORDER — STERILE WATER FOR IRRIGATION IR SOLN
Status: DC | PRN
  Administered 2021-10-28: 1000 mL

## 2021-10-28 MED ORDER — HYDROCODONE-ACETAMINOPHEN 5-325 MG PO TABS: 1.0000 | ORAL_TABLET | Freq: Four times a day (QID) | ORAL | 0 refills | Status: AC | PRN

## 2021-10-28 SURGICAL SUPPLY — 44 items
APPLIER CLIP 5 13 M/L LIGAMAX5 (MISCELLANEOUS) ×1
BAG COUNTER SPONGE SURGICOUNT (BAG) ×1 IMPLANT
BLADE CLIPPER SURG (BLADE) IMPLANT
CANISTER SUCT 3000ML PPV (MISCELLANEOUS) ×1 IMPLANT
CHLORAPREP W/TINT 26 (MISCELLANEOUS) ×1 IMPLANT
CLIP APPLIE 5 13 M/L LIGAMAX5 (MISCELLANEOUS) ×1 IMPLANT
CNTNR URN SCR LID CUP LEK RST (MISCELLANEOUS) IMPLANT
CONT SPEC 4OZ STRL OR WHT (MISCELLANEOUS) ×1
COVER SURGICAL LIGHT HANDLE (MISCELLANEOUS) ×1 IMPLANT
DERMABOND IMPLANT
DERMABOND ADVANCED .7 DNX12 (GAUZE/BANDAGES/DRESSINGS) ×1 IMPLANT
ELECT REM PT RETURN 9FT ADLT (ELECTROSURGICAL) ×1
ELECTRODE REM PT RTRN 9FT ADLT (ELECTROSURGICAL) ×1 IMPLANT
GLOVE BIOGEL PI IND STRL 6 (GLOVE) ×1 IMPLANT
GLOVE BIOGEL PI MICRO STRL 5.5 (GLOVE) ×1 IMPLANT
GOWN STRL REUS W/ TWL LRG LVL3 (GOWN DISPOSABLE) ×3 IMPLANT
GOWN STRL REUS W/TWL LRG LVL3 (GOWN DISPOSABLE) ×3
GRASPER SUT TROCAR 14GX15 (MISCELLANEOUS) IMPLANT
KIT BASIN OR (CUSTOM PROCEDURE TRAY) ×1 IMPLANT
KIT TURNOVER KIT B (KITS) ×1 IMPLANT
L-HOOK LAP DISP 36CM (ELECTROSURGICAL) ×1
LHOOK LAP DISP 36CM (ELECTROSURGICAL) ×1 IMPLANT
NDL INSUFFLATION 14GA 120MM (NEEDLE) IMPLANT
NEEDLE INSUFFLATION 14GA 120MM (NEEDLE) ×1 IMPLANT
NS IRRIG 1000ML POUR BTL (IV SOLUTION) ×1 IMPLANT
PAD ARMBOARD 7.5X6 YLW CONV (MISCELLANEOUS) ×1 IMPLANT
PENCIL BUTTON HOLSTER BLD 10FT (ELECTRODE) ×1 IMPLANT
POUCH SPECIMEN RETRIEVAL 10MM (ENDOMECHANICALS) ×1 IMPLANT
SCISSORS LAP 5X35 DISP (ENDOMECHANICALS) ×1 IMPLANT
SET IRRIG TUBING LAPAROSCOPIC (IRRIGATION / IRRIGATOR) ×1 IMPLANT
SET TUBE SMOKE EVAC HIGH FLOW (TUBING) ×1 IMPLANT
SLEEVE ENDOPATH XCEL 5M (ENDOMECHANICALS) ×2 IMPLANT
SUT MNCRL AB 4-0 PS2 18 (SUTURE) ×1 IMPLANT
SUT VIC AB 3-0 SH 27 (SUTURE)
SUT VIC AB 3-0 SH 27XBRD (SUTURE) IMPLANT
TOWEL GREEN STERILE (TOWEL DISPOSABLE) ×1 IMPLANT
TOWEL GREEN STERILE FF (TOWEL DISPOSABLE) ×1 IMPLANT
TRAY LAPAROSCOPIC MC (CUSTOM PROCEDURE TRAY) ×1 IMPLANT
TROCAR 11X100 Z THREAD (TROCAR) IMPLANT
TROCAR XCEL 12X100 BLDLESS (ENDOMECHANICALS) IMPLANT
TROCAR XCEL BLUNT TIP 100MML (ENDOMECHANICALS) ×1 IMPLANT
TROCAR Z-THREAD OPTICAL 5X100M (TROCAR) ×1 IMPLANT
WARMER LAPAROSCOPE (MISCELLANEOUS) ×1 IMPLANT
WATER STERILE IRR 1000ML POUR (IV SOLUTION) ×1 IMPLANT

## 2021-10-28 NOTE — Anesthesia Postprocedure Evaluation (Signed)
Anesthesia Post Note  Patient: Kenya Shiraishi  Procedure(s) Performed: LAPAROSCOPIC CHOLECYSTECTOMY (Abdomen)     Patient location during evaluation: PACU Anesthesia Type: General Level of consciousness: awake and alert Pain management: pain level controlled Vital Signs Assessment: post-procedure vital signs reviewed and stable Respiratory status: spontaneous breathing, nonlabored ventilation and respiratory function stable Cardiovascular status: blood pressure returned to baseline Postop Assessment: no apparent nausea or vomiting Anesthetic complications: no   No notable events documented.  Last Vitals:  Vitals:   10/28/21 1228 10/28/21 1230  BP: 116/86 116/86  Pulse: (!) 101 (!) 102  Resp: 11 19  Temp:  36.8 C  SpO2: 94% 98%    Last Pain:  Vitals:   10/28/21 1230  PainSc: 0-No pain                 Shanda Howells

## 2021-10-28 NOTE — Discharge Instructions (Addendum)
[facility] DISCHARGE INSTRUCTIONS  Activity No heavy lifting greater than 15 pounds for 4 weeks after surgery. Ok to shower in 24 hours, but do not bathe or submerge incisions underwater. Do not drive while taking narcotic pain medication.  Wound Care Your incisions are covered with skin glue called Dermabond. This will peel off on its own over time. You may shower and allow warm soapy water to run over your incisions. Gently pat dry. Do not submerge your incision underwater. Monitor your incision for any new redness, tenderness, or drainage.  When to Call us: Fever greater than 100.5 New redness, drainage, or swelling at incision site Severe pain, nausea, or vomiting Jaundice (yellowing of the whites of the eyes or skin)  Follow-up You have an appointment scheduled with Dr. Freida Busman on November 18, 2021 at 10:20am. This will be at the [facility] office at [address], [place], [place]. Please arrive at least 15 minutes prior to your scheduled appointment time.  For questions or concerns, please call the office at [phone].

## 2021-10-28 NOTE — Op Note (Signed)
Date: 10/28/21  Patient: Adam Miles MRN: [id number]  Preoperative Diagnosis: Biliary dyskinesia Postoperative Diagnosis: Same  Procedure: Laparoscopic cholecystectomy  Surgeon: Sophronia Simas, MD Assistant: Coralie Keens, MD (Resident)  EBL: 50 mL  Anesthesia: General endotracheal  Specimens: Gallbladder  Indications: Mr. Law is a 36 yo male who presented with postprandial RUQ abdominal pain. [place] did not show any gallstones, however a HIDA showed a mildly reduced EF of 28%. After a discussion of the risks and benefits of surgery, he agreed to proceed with cholecystectomy.   Findings: Severe hepatic steatosis. No evidence of cholecystitis.  Procedure details: Informed consent was obtained in the preoperative area prior to the procedure. The patient was brought to the operating room and placed on the table in the supine position. General anesthesia was induced and appropriate lines and drains were placed for intraoperative monitoring. Perioperative antibiotics were administered per SCIP guidelines. The abdomen was prepped and draped in the usual sterile fashion. A pre-procedure timeout was taken verifying patient identity, surgical site and procedure to be performed.  A small supraumbilical skin incision was made, the subcutaneous tissue was divided with cautery, and the umbilical stalk was grasped and elevated. A Veress needle was inserted through the fascia and intraperitoneal placement was confirmed with the saline drop test. The abdomen was insufflated and a 70mm trocar was placed. The peritoneal cavity was inspected with no evidence of visceral or vascular injury. Three 28mm ports were placed in the right subcostal margin, all under direct visualization. The fundus of the gallbladder was grasped and retracted cephalad. The infundibulum was retracted laterally. The liver was not cirrhotic but was severely steatotic, and the gallbladder was intrahepatic, which made retraction and exposure  difficult. The cystic triangle was dissected out using cautery and blunt dissection. This was a somewhat tedious dissection as retraction was challenging. The cystic artery was divided with cautery during this dissection. The cystic artery stump was grasped and clipped. The cystic triangle was further cleared of fatty tissue using cautery and blunt dissection. At this point the cystic duct was clearly visible as the only structure entering the gallbladder. The cystic duct was clipped close to the gallbladder, leaving two clips behind on the cystic duct stump. The gallbladder was taken off the liver using cautery. This was facilitated by starting at the fundus and working in a dome-down fashion. The specimen was placed in an endocatch bag. The surgical site was irrigated with saline until the effluent was clear. Hemostasis was achieved in the gallbladder fossa using cautery. The cystic duct and artery stumps were visually inspected and there was no evidence of bile leak or bleeding. The specimen was extracted via the umbilical port site. The umbilical port site fascia was closed with a 0 vicryl suture using a PMI. The remaining ports were removed and the abdomen was desufflated. The skin at all port sites was closed with 4-0 monocryl subcuticular suture. Dermabond was applied.  The patient tolerated the procedure well with no apparent complications. All counts were correct x2 at the end of the procedure. The patient was extubated and taken to PACU in stable condition.  Sophronia Simas, MD 10/28/21 12:04 PM

## 2021-10-28 NOTE — Anesthesia Procedure Notes (Signed)
Procedure Name: Intubation Date/Time: 10/28/2021 10:23 AM  Performed by: Erick Colace, CRNAPre-anesthesia Checklist: Patient identified, Emergency Drugs available, Suction available and Patient being monitored Patient Re-evaluated:Patient Re-evaluated prior to induction Oxygen Delivery Method: Circle system utilized Preoxygenation: Pre-oxygenation with 100% oxygen Induction Type: IV induction and Cricoid Pressure applied Ventilation: Mask ventilation without difficulty, Two handed mask ventilation required and Oral airway inserted - appropriate to patient size Laryngoscope Size: Mac and 4 Grade View: Grade III Tube type: Oral Tube size: 7.5 mm Number of attempts: 1 Airway Equipment and Method: Stylet and Oral airway Placement Confirmation: ETT inserted through vocal cords under direct vision, positive ETCO2 and breath sounds checked- equal and bilateral Secured at: 23 cm Tube secured with: Tape Dental Injury: Teeth and Oropharynx as per pre-operative assessment

## 2021-10-28 NOTE — Transfer of Care (Signed)
Immediate Anesthesia Transfer of Care Note  Patient: Adam Miles  Procedure(s) Performed: LAPAROSCOPIC CHOLECYSTECTOMY (Abdomen)  Patient Location: PACU  Anesthesia Type:General  Level of Consciousness: awake and alert   Airway & Oxygen Therapy: Patient Spontanous Breathing  Post-op Assessment: Report given to RN and Post -op Vital signs reviewed and stable  Post vital signs: Reviewed and stable  Last Vitals:  Vitals Value Taken Time  BP 153/102 10/28/21 1153  Temp    Pulse 104 10/28/21 1156  Resp 0 10/28/21 1156  SpO2 94 % 10/28/21 1156  Vitals shown include unvalidated device data.  Last Pain: There were no vitals filed for this visit.       Complications: No notable events documented.

## 2021-10-28 NOTE — Interval H&P Note (Signed)
History and Physical Interval Note:  10/28/2021 9:43 AM  Adam Miles  has presented today for surgery, with the diagnosis of BILIARY DYSKINESIA.  The various methods of treatment have been discussed with the patient and family. After consideration of risks, benefits and other options for treatment, the patient has consented to  Procedure(s): LAPAROSCOPIC CHOLECYSTECTOMY (N/A) as a surgical intervention.  The patient's history has been reviewed, patient examined, no change in status, stable for surgery.  I have reviewed the patient's chart and labs.  Questions were answered to the patient's satisfaction.     Fritzi Mandes

## 2021-10-29 ENCOUNTER — Encounter (HOSPITAL_COMMUNITY): Payer: Self-pay | Admitting: Surgery

## 2021-10-29 LAB — SURGICAL PATHOLOGY
